# Patient Record
Sex: Female | Born: 1965 | Race: White | Hispanic: No | Marital: Married | Smoking: Current every day smoker
Health system: Southern US, Community
[De-identification: ages and names within clinical notes are randomized; demographics above are authoritative.]

## PROBLEM LIST (undated history)

## (undated) DIAGNOSIS — N83209 Unspecified ovarian cyst, unspecified side: Secondary | ICD-10-CM

## (undated) DIAGNOSIS — B977 Papillomavirus as the cause of diseases classified elsewhere: Secondary | ICD-10-CM

## (undated) DIAGNOSIS — G43909 Migraine, unspecified, not intractable, without status migrainosus: Secondary | ICD-10-CM

## (undated) DIAGNOSIS — G4700 Insomnia, unspecified: Secondary | ICD-10-CM

## (undated) HISTORY — DX: Unspecified ovarian cyst, unspecified side: N83.209

## (undated) HISTORY — PX: WISDOM TOOTH EXTRACTION: SHX21

## (undated) HISTORY — DX: Papillomavirus as the cause of diseases classified elsewhere: B97.7

## (undated) HISTORY — DX: Migraine, unspecified, not intractable, without status migrainosus: G43.909

## (undated) HISTORY — DX: Insomnia, unspecified: G47.00

---

## 1999-01-02 ENCOUNTER — Other Ambulatory Visit: Admission: RE | Admit: 1999-01-02 | Discharge: 1999-01-02 | Payer: Self-pay | Admitting: *Deleted

## 2000-10-05 ENCOUNTER — Other Ambulatory Visit: Admission: RE | Admit: 2000-10-05 | Discharge: 2000-10-05 | Payer: Self-pay | Admitting: Obstetrics and Gynecology

## 2001-10-27 ENCOUNTER — Other Ambulatory Visit: Admission: RE | Admit: 2001-10-27 | Discharge: 2001-10-27 | Payer: Self-pay | Admitting: *Deleted

## 2002-10-24 ENCOUNTER — Other Ambulatory Visit: Admission: RE | Admit: 2002-10-24 | Discharge: 2002-10-24 | Payer: Self-pay | Admitting: Obstetrics and Gynecology

## 2003-11-30 ENCOUNTER — Other Ambulatory Visit: Admission: RE | Admit: 2003-11-30 | Discharge: 2003-11-30 | Payer: Self-pay | Admitting: Obstetrics and Gynecology

## 2004-08-13 ENCOUNTER — Ambulatory Visit (HOSPITAL_COMMUNITY): Admission: RE | Admit: 2004-08-13 | Discharge: 2004-08-13 | Payer: Self-pay | Admitting: Family Medicine

## 2004-11-19 ENCOUNTER — Ambulatory Visit (HOSPITAL_COMMUNITY): Admission: RE | Admit: 2004-11-19 | Discharge: 2004-11-19 | Payer: Self-pay | Admitting: Family Medicine

## 2005-02-10 ENCOUNTER — Other Ambulatory Visit: Admission: RE | Admit: 2005-02-10 | Discharge: 2005-02-10 | Payer: Self-pay | Admitting: Obstetrics and Gynecology

## 2006-02-12 ENCOUNTER — Other Ambulatory Visit: Admission: RE | Admit: 2006-02-12 | Discharge: 2006-02-12 | Payer: Self-pay | Admitting: Obstetrics and Gynecology

## 2010-04-28 ENCOUNTER — Encounter
Admission: RE | Admit: 2010-04-28 | Discharge: 2010-04-28 | Payer: Self-pay | Source: Home / Self Care | Attending: Allergy and Immunology | Admitting: Allergy and Immunology

## 2010-05-23 ENCOUNTER — Ambulatory Visit
Admission: RE | Admit: 2010-05-23 | Discharge: 2010-05-23 | Disposition: A | Discharge status: Home / Self Care | Payer: BC Managed Care – PPO | Source: Ambulatory Visit

## 2010-05-23 ENCOUNTER — Other Ambulatory Visit: Payer: Self-pay

## 2010-05-23 DIAGNOSIS — T1490XA Injury, unspecified, initial encounter: Secondary | ICD-10-CM

## 2010-06-05 ENCOUNTER — Other Ambulatory Visit: Payer: Self-pay | Admitting: Pulmonary Disease

## 2010-06-05 DIAGNOSIS — R05 Cough: Secondary | ICD-10-CM

## 2010-06-06 MED ORDER — IOHEXOL 300 MG/ML  SOLN: 100.0000 mL | Freq: Once | INTRAMUSCULAR | Status: AC | PRN

## 2010-06-09 ENCOUNTER — Ambulatory Visit
Admission: RE | Admit: 2010-06-09 | Discharge: 2010-06-09 | Disposition: A | Discharge status: Home / Self Care | Payer: BC Managed Care – PPO | Source: Ambulatory Visit | Attending: Pulmonary Disease | Admitting: Pulmonary Disease

## 2010-06-09 DIAGNOSIS — R05 Cough: Secondary | ICD-10-CM

## 2010-06-10 ENCOUNTER — Observation Stay (HOSPITAL_COMMUNITY)
Admission: EM | Admit: 2010-06-10 | Discharge: 2010-06-11 | Disposition: A | Discharge status: Home / Self Care | Payer: BC Managed Care – PPO | Attending: Family Medicine | Admitting: Family Medicine

## 2010-06-10 ENCOUNTER — Emergency Department (HOSPITAL_COMMUNITY): Payer: BC Managed Care – PPO

## 2010-06-10 DIAGNOSIS — R059 Cough, unspecified: Secondary | ICD-10-CM | POA: Insufficient documentation

## 2010-06-10 DIAGNOSIS — J441 Chronic obstructive pulmonary disease with (acute) exacerbation: Secondary | ICD-10-CM

## 2010-06-10 DIAGNOSIS — R062 Wheezing: Secondary | ICD-10-CM | POA: Insufficient documentation

## 2010-06-10 DIAGNOSIS — J45901 Unspecified asthma with (acute) exacerbation: Secondary | ICD-10-CM

## 2010-06-10 DIAGNOSIS — D72829 Elevated white blood cell count, unspecified: Secondary | ICD-10-CM | POA: Insufficient documentation

## 2010-06-10 DIAGNOSIS — F17200 Nicotine dependence, unspecified, uncomplicated: Secondary | ICD-10-CM | POA: Insufficient documentation

## 2010-06-10 DIAGNOSIS — R05 Cough: Secondary | ICD-10-CM | POA: Insufficient documentation

## 2010-06-10 LAB — DIFFERENTIAL
Basophils Relative: 0 % (ref 0–1)
Eosinophils Absolute: 0 10*3/uL (ref 0.0–0.7)
Lymphs Abs: 1.5 10*3/uL (ref 0.7–4.0)
Monocytes Relative: 8 % (ref 3–12)
Neutro Abs: 11.6 10*3/uL — ABNORMAL HIGH (ref 1.7–7.7)
Neutrophils Relative %: 81 % — ABNORMAL HIGH (ref 43–77)

## 2010-06-10 LAB — COMPREHENSIVE METABOLIC PANEL
ALT: 36 U/L — ABNORMAL HIGH (ref 0–35)
Alkaline Phosphatase: 52 U/L (ref 39–117)
GFR calc Af Amer: >60 mL/min (ref >60)
GFR calc non Af Amer: >60 mL/min (ref >60)
Potassium: 4.2 mEq/L (ref 3.5–5.1)
Sodium: 141 mEq/L (ref 135–145)

## 2010-06-10 LAB — CBC
HCT: 39.7 % (ref 36.0–46.0)
Hemoglobin: 13.6 g/dL (ref 12.0–15.0)
MCHC: 34.3 g/dL (ref 30.0–36.0)
RDW: 14.6 % (ref 11.5–15.5)
WBC: 14.4 10*3/uL — ABNORMAL HIGH (ref 4.0–10.5)

## 2010-06-10 LAB — D-DIMER, QUANTITATIVE: D-Dimer, Quant: <0.22 ug/mL-FEU (ref 0.00–0.48)

## 2010-06-11 LAB — BASIC METABOLIC PANEL
CO2: 24 mEq/L (ref 19–32)
Calcium: 9.6 mg/dL (ref 8.4–10.5)
Chloride: 103 mEq/L (ref 96–112)
GFR calc Af Amer: >60 mL/min (ref >60)
Potassium: 4.3 mEq/L (ref 3.5–5.1)

## 2010-06-11 LAB — CBC
MCV: 85.1 fL (ref 78.0–100.0)
Platelets: 371 10*3/uL (ref 150–400)

## 2010-06-30 NOTE — H&P (Signed)
NAME:  Rebecca Mcguire, Rebecca Mcguire NO.:  [id number]  MEDICAL RECORD NO.:  [id number]           PATIENT TYPE:  I  LOCATION:  4505                         FACILITY:  [facility]  PHYSICIAN:  Santiago Bumpers. Demosthenes Virnig, M.D.DATE OF BIRTH:  [birth year]  DATE OF ADMISSION:  06/10/2010 DATE OF DISCHARGE:                             HISTORY & PHYSICAL   PRIMARY CARE PROVIDER:  [facility].  PULMONOLOGIST:  Kerry Kass, MD [facility].  CHIEF COMPLAINT:  Wheezing, cough, and shortness of breath.  HISTORY OF PRESENT ILLNESS:  The patient states that she has been dealing with a cough and shortness of breath since October.  She says that she has seen her pulmonologist multiple times and she had gotten a little bit better for a while.  However, last Thursday, she became much worse, coughing, shortness of breath, and wheezing worsened.  She saw her pulmonologist again, who started her on Avelox and prednisone.  She says that she went back for followup today and she had not gotten any better, so her pulmonologist felt that she needed to be admitted.  REVIEW OF SYSTEMS:  GENERAL:  Positive for low-grade fever.  HEAD, EYES, EARS, NOSE, AND THROAT: Positive for rhinorrhea.  CARDIOVASCULAR: Negative for chest pain.  RESPIRATORY: Positive for cough and dyspnea. Negative for sputum.  GI:  Negative for nausea, vomiting, abdominal pain.  MUSCULOSKELETAL:  Negative for myalgias or arthralgias. NEUROLOGIC:  Negative for dizziness.  Negative for headaches.  PAST MEDICAL HISTORY:  Significant for: 1. Menstrual migraine headaches. 2. Chronic bronchitis.  PAST SURGICAL HISTORY:  Negative.  SOCIAL HISTORY:  The patient lives with her boyfriend.  She works on Network engineer at [employer].  She has quit smoking about 2 months ago, but smoked for about 20 years, about half a pack per day.  She denies alcohol or drug use.  FAMILY HISTORY:  Significant  only for allergies in her father and siblings.  ALLERGIES:  The patient has GI reaction, vomiting, and abdominal pain to BIAXIN.  No other known drug allergies.  MEDICATIONS: 1. Prednisone tapered. 2. Avelox 400 mg p.o. daily. 3. Oral contraceptive pill. 4. Celexa 40 mg p.o. daily. 5. Celebrex p.r.n. for pain. 6. Vicodin p.r.n. for migraines. 7. Amerge p.r.n. for migraines. 8. Symbicort inhaled b.i.d. 9. Albuterol inhaler 2 puffs inhaled q.4 p.r.n. shortness of breath.  PHYSICAL EXAMINATION:  VITAL SIGNS:  Temperature 98.2, pulse 85, respiratory rate 22, blood pressure 130/82, pulse ox is 99% on room air. GENERAL:  The patient is in no acute distress. HEAD, EYES, EARS, NOSE, AND THROAT:  Extraocular movements intact.  Oral mucosa is moist. NECK:  Supple. CARDIOVASCULAR:  Regular rate and rhythm.  No murmurs, rubs, or gallops. CHEST:  Positive pain with sternal palpation. LUNGS:  Good air movement, but scattered wheezes in the left greater than right lung. ABDOMEN:  Positive bowel sounds.  Soft and nontender. EXTREMITIES:  2+ pulses.  No edema. NEUROLOGIC:  Alert and oriented x3.  No focal deficits. MUSCULOSKELETAL:  Right index finger is in splint, status post fracture.  LABS  AND STUDIES:  Complete metabolic panel:  Sodium 141, potassium 4.2, chloride 104, CO2 of 28, BUN 8, creatinine 0.65, glucose 84.  Total bilirubin 0.5, alk phos 52, AST 31, ALT 36, total protein 7.5, albumin 12.5, calcium 9.7.  CBC:  White blood cell count 14.4, hemoglobin 13.6, hematocrit 39.7, platelets 391, neutrophils 81%, lymphocytes 11%, monocytes 8%.  Chest x-ray:  No active cardiopulmonary disease.  ASSESSMENT AND PLAN:  This is a 45 year old female, who presents with shortness of breath, cough, and wheezing, who has failed outpatient treatment. 1. Shortness of breath, cough, and wheezing:  The patient does not     carry a diagnosis of chronic obstructive pulmonary disease, but     from her  imaging studies and symptoms, I feel this is most likely a     chronic obstructive pulmonary disease exacerbation.  We will treat     her with prednisone, Avelox, and albuterol and Atrovent nebulizer     treatments.  We will also order a D-dimer and EKG to evaluate her     other causes of her symptoms.  We will give Vicodin p.r.n. for her     cough pain.  We will recommend that the patient get pulmonary     function tests for evaluation of her lung disease as an outpatient. 2. Leukocytosis:  The patient has been on prednisone.  This is most     likely cause.  We will monitor for fever and other signs of     infection. 3. Tobacco use:  The patient quit smoking about 2 months ago due to     her symptoms.  She does not feel she needs further smoking     cessation help at this time. 4. Fluids, electrolytes, nutrition/gastrointestinal:  We will Hep-Lock     her IV and give her a regular diet.  We will give Protonix to     empirically treat for gastroesophageal reflux disease symptoms as     possible other cause of     cough. 5. Deep venous thrombosis prophylaxis:  Heparin 5000 units subcu t.i.d. 6. Disposition:  Pending clinical improvement.    ______________________________ Ardyth Gal, MD   ______________________________ Santiago Bumpers. Leveda Anna, M.D.    CR/MEDQ  D:  06/10/2010  T:  06/11/2010  Job:  [id number]  Electronically Signed by Ardyth Gal MD on 06/29/2010 12:03:45 PM Electronically Signed by Doralee Albino M.D. on 06/30/2010 09:09:18 AM

## 2010-06-30 NOTE — Discharge Summary (Signed)
NAME:  Rebecca Mcguire, Rebecca Mcguire NO.:  [id number]  MEDICAL RECORD NO.:  [id number]           PATIENT TYPE:  I  LOCATION:  4505                         FACILITY:  [facility]  PHYSICIAN:  Santiago Bumpers. Saw Mendenhall, M.D.DATE OF BIRTH:  1966-01-17  DATE OF ADMISSION:  06/10/2010 DATE OF DISCHARGE:  06/11/2010                              DISCHARGE SUMMARY   PRIMARY CARE PROVIDER:  [facility].  PULMONOLOGIST:  Dr. Corinda [facility]ubler at [facility].  DISCHARGE DIAGNOSIS:  Chronic obstructive pulmonary disease versus asthma exacerbation.  DISCHARGE MEDICATIONS: 1. Prednisone 60 mg p.o. daily x3 days, then 40 mg p.o. daily x3 days,     then 20 mg p.o. daily x3 days, then 10 mg p.o. daily x4 days. 2. Citalopram 40 mg p.o. daily. 3. Vicodin 5/325 p.o. q.6 h. p.r.n. cough pain. 4. Avelox 400 mg p.o. daily x3 days. 5. Albuterol inhaler, use with spacer every 4 hours while awake x1     day, then use every 4 hours as needed for shortness of breath. 6. Symbicort 2 puffs inhaled daily.  PERTINENT LABORATORY VALUES:  On June 10, 2010, a CBC with differential significant only for white blood cell count of 14.4. Comprehensive metabolic panel was within normal limits.  D-dimer was less than 0.22.  RADIOLOGY:  June 10, 2010, a chest x-ray two-view showed stable examination, no active cardiopulmonary process.  BRIEF HOSPITAL COURSE:  Rebecca Mcguire is a 45 year old female who presented to the emergency department after seeing her pulmonologist, Dr. Corinda Gubler for continued wheezing, cough, and shortness of breath. 1. Pulmonology.  The patient has had a chronic bronchitis since     October 2011.  Her chest x-ray is suggestive of chronic lung     disease of asthma for COPD.  The patient only has about a 10-pack-     year history of smoking, so it is unclear what the etiology of her     lung disease as COPD usually does not develop after only 10-pack-     year of  smoking, however, the Family Medicine Team admitted her to     the floor and treated her for COPD exacerbation with scheduled     Atrovent and albuterol nebulizers.  The patient received one dose     of IV Solu-Medrol in the emergency room and then was switched to     p.o. prednisone.  She was also continued on Avelox 400 mg p.o.     daily that had been started in outpatient setting.  The patient was     given Vicodin for her cough.  On admission, the patient had poor     air movement and diffuse wheezes throughout her lungs.  On the day     of discharge, she had minimal wheezes and good air movement     throughout her lungs.  She was on room air during the entire     hospitalization.  She was discharged home with a prednisone taper.     Avelox to complete a 10-day course.  Symbicort and albuterol, she     is  to schedule the albuterol for 24 hours and then use it p.r.n.  FOLLOWUP ISSUES/RECOMMENDATIONS:  The patient should follow up with Dr. Corinda [facility] at [facility] for management of her lung disease.  The patient was discharged home in stable medical condition.    ______________________________ Ardyth Gal, MD   ______________________________ Santiago Bumpers. Leveda Anna, M.D.    CR/MEDQ  D:  06/11/2010  T:  06/11/2010  Job:  [id number]  cc:   [facility] Dr. Corinda Gubler  Electronically Signed by Ardyth Gal MD on 06/29/2010 12:03:21 PM Electronically Signed by Doralee Albino M.D. on 06/30/2010 04:54:09 AM

## 2010-08-11 ENCOUNTER — Other Ambulatory Visit: Payer: Self-pay | Admitting: Orthopedic Surgery

## 2010-08-11 DIAGNOSIS — T1490XA Injury, unspecified, initial encounter: Secondary | ICD-10-CM

## 2011-06-11 ENCOUNTER — Other Ambulatory Visit: Payer: Self-pay | Admitting: Obstetrics and Gynecology

## 2011-06-11 DIAGNOSIS — Z1231 Encounter for screening mammogram for malignant neoplasm of breast: Secondary | ICD-10-CM

## 2011-06-22 ENCOUNTER — Ambulatory Visit
Admission: RE | Admit: 2011-06-22 | Discharge: 2011-06-22 | Disposition: A | Discharge status: Home / Self Care | Payer: BC Managed Care – PPO | Source: Ambulatory Visit | Attending: Obstetrics and Gynecology | Admitting: Obstetrics and Gynecology

## 2011-06-22 DIAGNOSIS — Z1231 Encounter for screening mammogram for malignant neoplasm of breast: Secondary | ICD-10-CM

## 2011-07-02 ENCOUNTER — Encounter (INDEPENDENT_AMBULATORY_CARE_PROVIDER_SITE_OTHER): Payer: BC Managed Care – PPO

## 2011-07-02 ENCOUNTER — Encounter (INDEPENDENT_AMBULATORY_CARE_PROVIDER_SITE_OTHER): Payer: BC Managed Care – PPO | Admitting: Obstetrics and Gynecology

## 2011-07-02 DIAGNOSIS — I780 Hereditary hemorrhagic telangiectasia: Secondary | ICD-10-CM

## 2011-07-02 DIAGNOSIS — N949 Unspecified condition associated with female genital organs and menstrual cycle: Secondary | ICD-10-CM

## 2011-07-02 DIAGNOSIS — N925 Other specified irregular menstruation: Secondary | ICD-10-CM

## 2011-09-08 ENCOUNTER — Ambulatory Visit
Admission: RE | Admit: 2011-09-08 | Discharge: 2011-09-08 | Disposition: A | Discharge status: Home / Self Care | Payer: BC Managed Care – PPO | Source: Ambulatory Visit | Attending: Allergy and Immunology | Admitting: Allergy and Immunology

## 2011-09-08 ENCOUNTER — Other Ambulatory Visit: Payer: Self-pay | Admitting: Allergy and Immunology

## 2011-09-08 DIAGNOSIS — J45901 Unspecified asthma with (acute) exacerbation: Secondary | ICD-10-CM

## 2012-06-10 ENCOUNTER — Other Ambulatory Visit: Payer: Self-pay | Admitting: Obstetrics and Gynecology

## 2012-06-14 ENCOUNTER — Other Ambulatory Visit: Payer: Self-pay | Admitting: Obstetrics and Gynecology

## 2012-06-14 MED ORDER — NORETHINDRONE 0.35 MG PO TABS: 1.0000 | ORAL_TABLET | Freq: Every day | ORAL | Status: AC

## 2012-06-14 NOTE — Telephone Encounter (Signed)
Spoke with pt rgd msg pt wants rf on rx until aex in April informed pt rx sent to pharm pt voice understanding

## 2012-08-26 ENCOUNTER — Other Ambulatory Visit: Payer: Self-pay

## 2012-08-26 DIAGNOSIS — Z1231 Encounter for screening mammogram for malignant neoplasm of breast: Secondary | ICD-10-CM

## 2012-10-26 IMAGING — CR DG CHEST 2V
2 series · 2 of 2 positions shown · non-contrast
Comparison: None

CLINICAL DATA: Cough.

CHEST - 2 VIEW

[w chest pa]
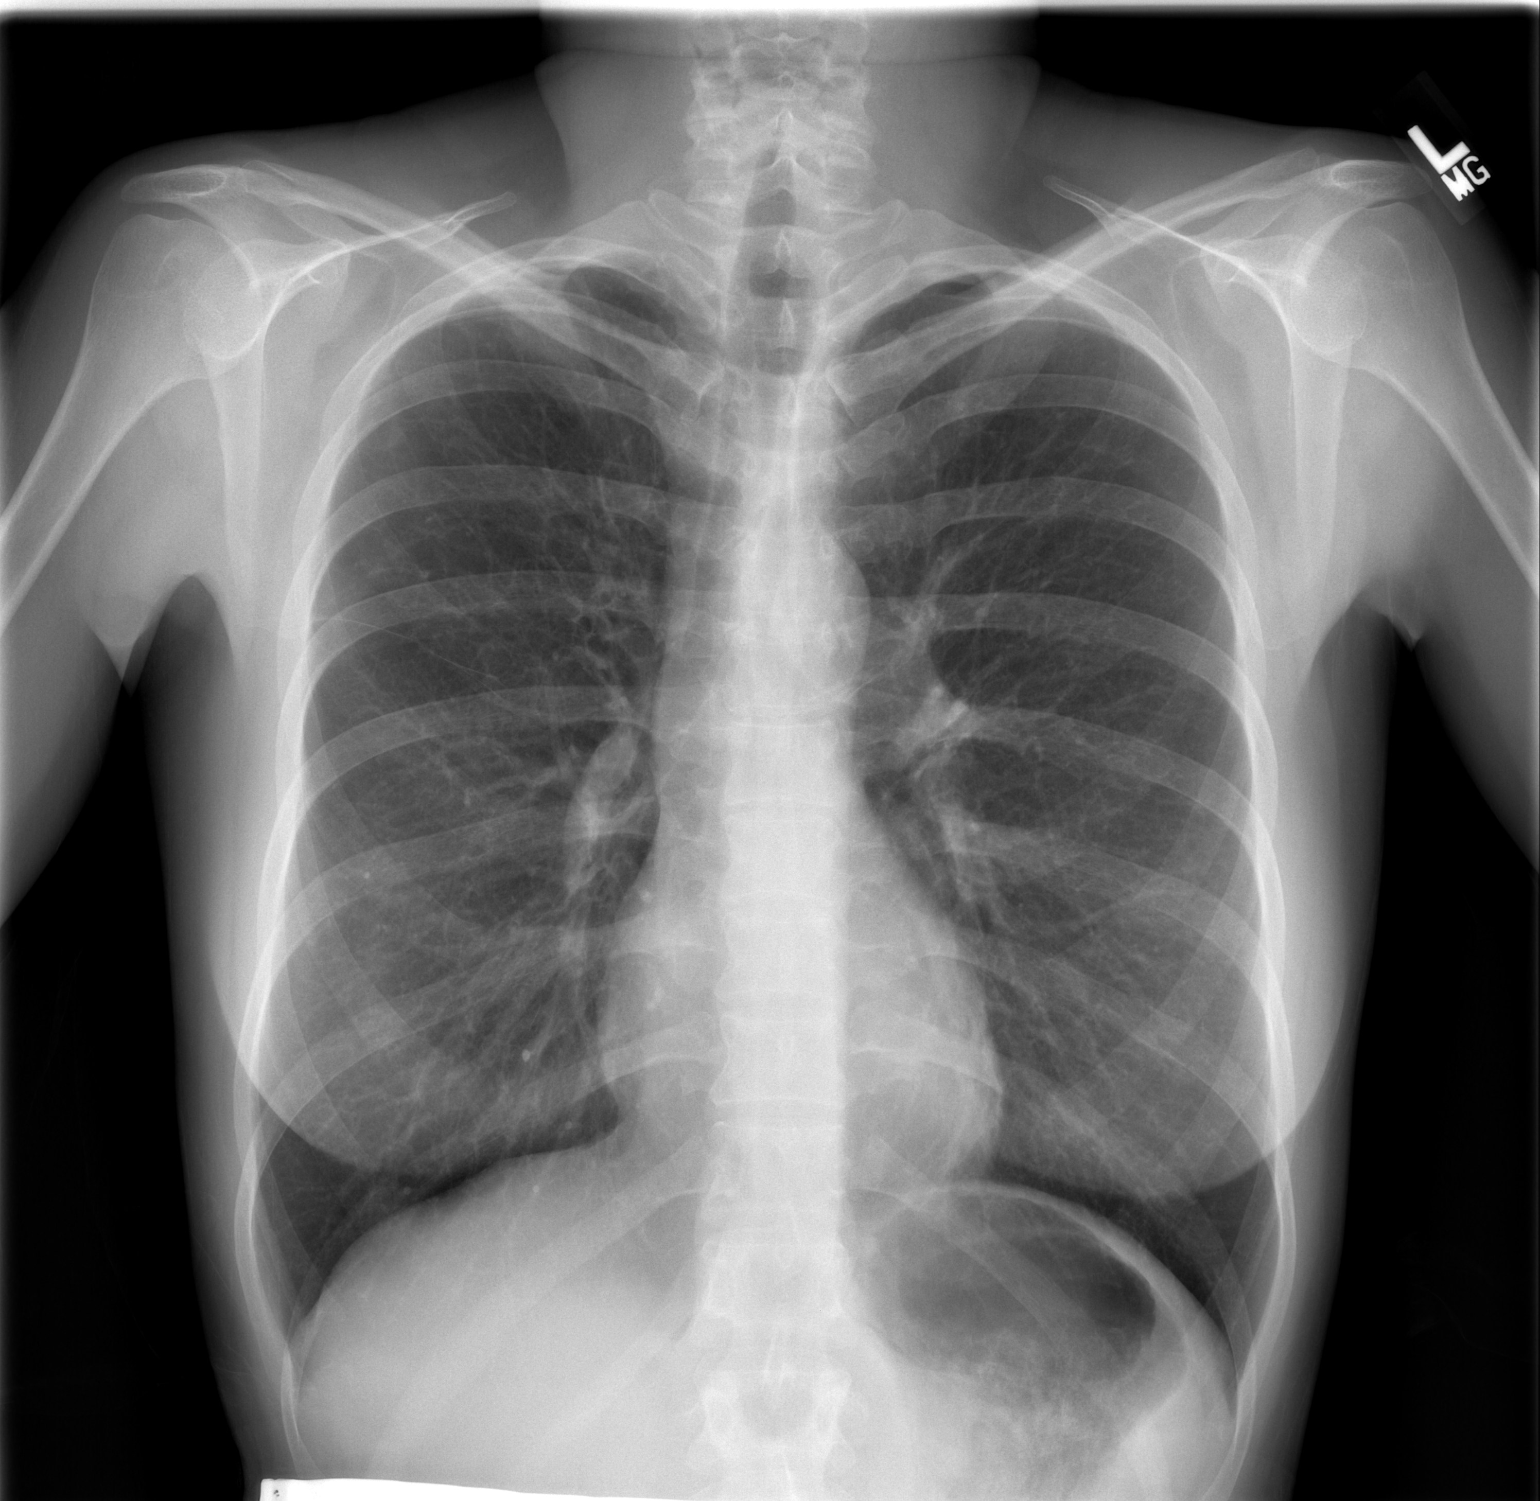

[w chest lat]
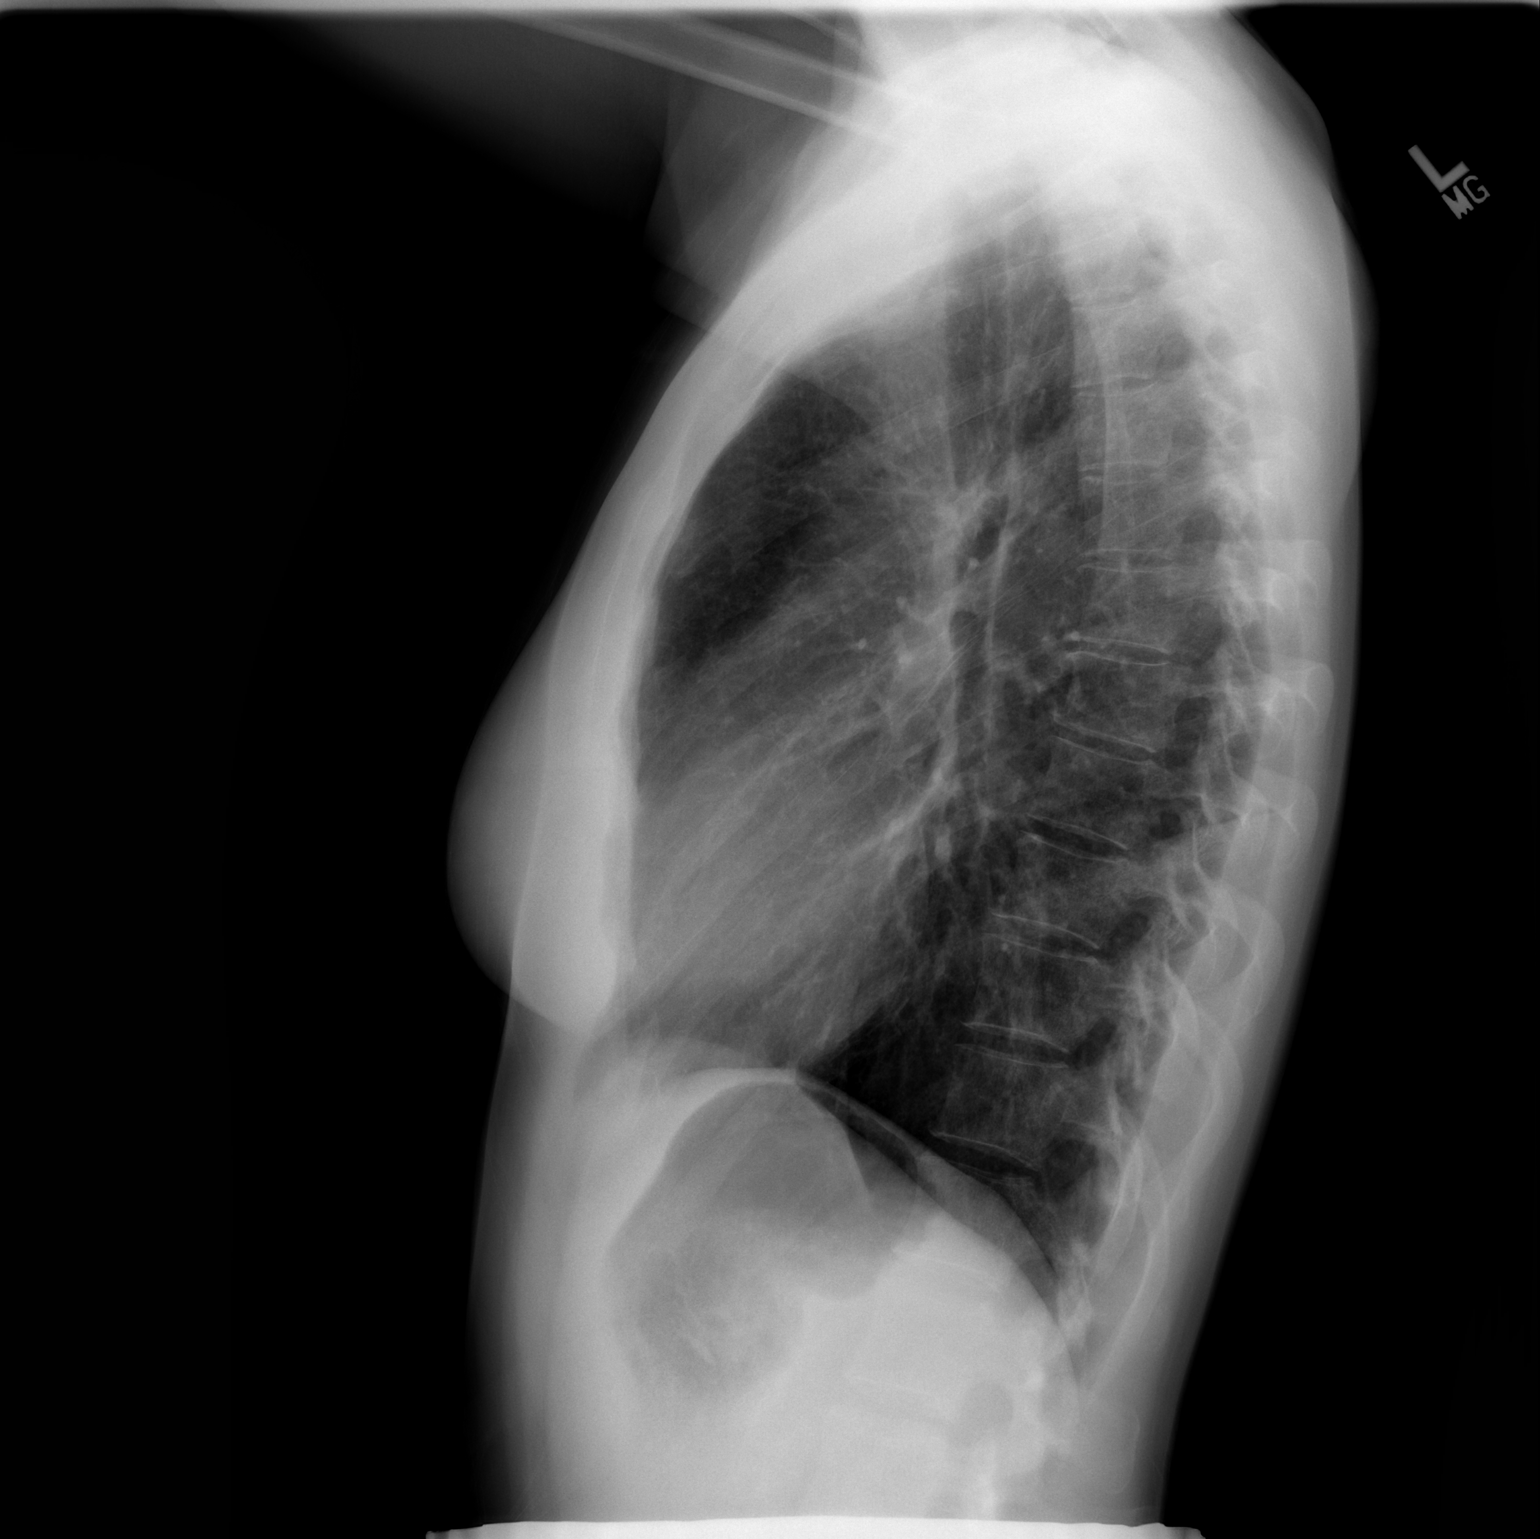

[2 of 2 positions shown; findings below may reference images not displayed]

FINDINGS: The cardiac silhouette, mediastinal and hilar contours
are within normal limits.  The lungs demonstrate mild
hyperinflation.  Vague densities in the right lung apex may be
scarring changes.  Prior films would be helpful for comparison.  If
none are available I would recommend a follow-up chest x-ray in 4
months to document stability.  No infiltrates, edema or effusions.
The bony thorax is intact.
IMPRESSION: 1.  Hyperinflation and possible early emphysematous changes.
2.  Possible right apical scarring changes.  Recommend correlation
with prior chest films or if none are available a follow-up chest x-
ray in 4 months.

## 2013-01-02 ENCOUNTER — Other Ambulatory Visit: Payer: Self-pay | Admitting: Family Medicine

## 2013-01-02 ENCOUNTER — Ambulatory Visit
Admission: RE | Admit: 2013-01-02 | Discharge: 2013-01-02 | Disposition: A | Discharge status: Home / Self Care | Payer: BC Managed Care – PPO | Source: Ambulatory Visit | Attending: Family Medicine | Admitting: Family Medicine

## 2013-01-02 DIAGNOSIS — S0003XA Contusion of scalp, initial encounter: Secondary | ICD-10-CM

## 2013-09-25 ENCOUNTER — Other Ambulatory Visit: Payer: Self-pay

## 2013-09-25 ENCOUNTER — Other Ambulatory Visit: Payer: Self-pay | Admitting: Obstetrics and Gynecology

## 2013-09-25 DIAGNOSIS — Z1231 Encounter for screening mammogram for malignant neoplasm of breast: Secondary | ICD-10-CM

## 2013-10-02 ENCOUNTER — Ambulatory Visit: Payer: BC Managed Care – PPO

## 2013-10-05 ENCOUNTER — Ambulatory Visit
Admission: RE | Admit: 2013-10-05 | Discharge: 2013-10-05 | Disposition: A | Discharge status: Home / Self Care | Payer: BC Managed Care – PPO | Source: Ambulatory Visit | Attending: Obstetrics and Gynecology | Admitting: Obstetrics and Gynecology

## 2013-10-05 DIAGNOSIS — Z1231 Encounter for screening mammogram for malignant neoplasm of breast: Secondary | ICD-10-CM

## 2016-02-05 ENCOUNTER — Other Ambulatory Visit: Payer: Self-pay | Admitting: Otolaryngology

## 2016-02-07 ENCOUNTER — Other Ambulatory Visit: Payer: Self-pay | Admitting: Otolaryngology

## 2016-02-10 ENCOUNTER — Other Ambulatory Visit: Payer: Self-pay | Admitting: Otolaryngology

## 2016-02-10 DIAGNOSIS — H9041 Sensorineural hearing loss, unilateral, right ear, with unrestricted hearing on the contralateral side: Secondary | ICD-10-CM

## 2016-02-10 DIAGNOSIS — H9311 Tinnitus, right ear: Secondary | ICD-10-CM

## 2016-02-10 DIAGNOSIS — IMO0001 Reserved for inherently not codable concepts without codable children: Secondary | ICD-10-CM

## 2016-02-13 ENCOUNTER — Ambulatory Visit
Admission: RE | Admit: 2016-02-13 | Discharge: 2016-02-13 | Disposition: A | Discharge status: Home / Self Care | Payer: BC Managed Care – PPO | Source: Ambulatory Visit | Attending: Otolaryngology | Admitting: Otolaryngology

## 2016-02-13 DIAGNOSIS — H9041 Sensorineural hearing loss, unilateral, right ear, with unrestricted hearing on the contralateral side: Secondary | ICD-10-CM

## 2016-02-13 DIAGNOSIS — H9311 Tinnitus, right ear: Secondary | ICD-10-CM

## 2016-02-13 DIAGNOSIS — IMO0001 Reserved for inherently not codable concepts without codable children: Secondary | ICD-10-CM

## 2016-02-13 MED ORDER — GADOBENATE DIMEGLUMINE 529 MG/ML IV SOLN
10.0000 mL | Freq: Once | INTRAVENOUS | Status: AC | PRN
  Administered 2016-02-13: 10 mL via INTRAVENOUS

## 2017-04-15 ENCOUNTER — Encounter: Payer: Self-pay | Admitting: Gastroenterology

## 2017-05-24 ENCOUNTER — Encounter: Payer: Self-pay | Admitting: Gastroenterology

## 2017-05-24 ENCOUNTER — Encounter: Payer: Self-pay | Admitting: *Deleted

## 2017-05-28 ENCOUNTER — Encounter (INDEPENDENT_AMBULATORY_CARE_PROVIDER_SITE_OTHER): Payer: Self-pay

## 2017-05-28 ENCOUNTER — Encounter: Payer: Self-pay | Admitting: Gastroenterology

## 2017-05-28 ENCOUNTER — Ambulatory Visit: Payer: BC Managed Care – PPO | Admitting: Gastroenterology

## 2017-05-28 VITALS — BP 98/60 | HR 66 | Ht 66.0 in | Wt 119.2 lb

## 2017-05-28 DIAGNOSIS — K649 Unspecified hemorrhoids: Secondary | ICD-10-CM

## 2017-05-28 DIAGNOSIS — K589 Irritable bowel syndrome without diarrhea: Secondary | ICD-10-CM | POA: Diagnosis not present

## 2017-05-28 DIAGNOSIS — L290 Pruritus ani: Secondary | ICD-10-CM

## 2017-05-28 NOTE — Patient Instructions (Signed)
If you are age 865 or older, your body mass index should be between 23-30. Your Body mass index is 19.25 kg/m. If this is out of the aforementioned range listed, please consider follow up with your Primary Care Provider.  If you are age 50 or younger, your body mass index should be between 19-25. Your Body mass index is 19.25 kg/m. If this is out of the aformentioned range listed, please consider follow up with your Primary Care Provider.   Use Preparation H suppositories at bedtime for 5 days.  Start Benefiber 1 tablespoon three times daily with meals.  You have been given Constipation literature.  Recall for colonoscopy Sept. 2019.  Thank you for choosing me and [facility].   Angelena SoleK. Nandigam, MD

## 2017-05-28 NOTE — Progress Notes (Signed)
Rebecca Mcguire    [id number]    [birth year]  Primary Care Physician:Mcguire, Rebecca Beechamynthia, MD  Referring Physician: Laurann Mcguire, Rebecca Mcguire. [address] [place], [place] [id number]  Chief complaint:  Anal itching, Hemorrhoids  HPI: [age] yr F here with complaints of worsening anal itching and burning sensation since September 2018. She was diagnosed around that time with genital herpes and she had peri anal lesions.  She denies any blood per rectum or blood in stool.  Occasionally she has noticed pinkish tinged fluid when she wipes multiple times after she strained with bowel movement.  She is taking MiraLAX as needed on and off and currently has regular bowel movements.  She is being treated for genital herpes and has noticed improvement with the perianal lesions and also itching.   Outpatient Encounter Medications as of 05/28/2017  Medication Sig  . albuterol (PROVENTIL) (2.5 MG/3ML) 0.083% nebulizer solution Take 2.5 mg by nebulization every 6 (six) hours as needed for wheezing or shortness of breath.  . ALPRAZolam (XANAX) 0.5 MG tablet Take 0.5 mg by mouth at bedtime as needed for anxiety.  . budesonide-formoterol (SYMBICORT) 160-4.5 MCG/ACT inhaler Inhale 2 puffs into the lungs 2 (two) times daily.  Marland Kitchen. EPINEPHrine (ADRENALIN) 0.1 % nasal solution Place 1 drop into the nose once.  . metaxalone (SKELAXIN) 800 MG tablet Take 800 mg by mouth 3 (three) times daily.  . mometasone-formoterol (DULERA) 100-5 MCG/ACT AERO Inhale 2 puffs into the lungs 2 (two) times daily.  . montelukast (SINGULAIR) 10 MG tablet Take 10 mg by mouth at bedtime.  . Multiple Vitamin (MULTIVITAMIN) tablet Take 1 tablet by mouth daily.  . naratriptan (AMERGE) 2.5 MG tablet Take 2.5 mg by mouth as needed for migraine. Take one (1) tablet at onset of headache; if returns or does not resolve, may repeat after 4 hours; do not exceed five (5) mg in 24 hours.  . norethindrone (CAMILA) 0.35 MG tablet Take 1 tablet  (0.35 mg total) by mouth daily.  Marland Kitchen. omeprazole (PRILOSEC) 40 MG capsule Take 40 mg by mouth daily.  . promethazine (PHENERGAN) 25 MG suppository Place 25 mg rectally every 6 (six) hours as needed for nausea or vomiting.  . triamterene-hydrochlorothiazide (DYAZIDE) 37.5-25 MG capsule Take 1 capsule by mouth daily.  . valACYclovir (VALTREX) 500 MG tablet Take 500 mg by mouth 2 (two) times daily.   No facility-administered encounter medications on file as of 05/28/2017.     Allergies as of 05/28/2017 - Review Complete 05/28/2017  Allergen Reaction Noted  . Biaxin [clarithromycin]  05/24/2017  . Celebrex [celecoxib]  05/24/2017  . Nsaids  05/24/2017  . Shellfish-derived products  05/24/2017    Past Medical History:  Diagnosis Date  . HPV in female   . Insomnia   . Migraine   . Ovarian cyst     Past Surgical History:  Procedure Laterality Date  . WISDOM TOOTH EXTRACTION      Family History  Problem Relation Age of Onset  . Other Mother     Social History   Socioeconomic History  . Marital status: Married    Spouse name: Not on file  . Number of children: 0  . Years of education: Not on file  . Highest education level: Not on file  Social Needs  . Financial resource strain: Not on file  . Food insecurity - worry: Not on file  .  Food insecurity - inability: Not on file  . Transportation needs - medical: Not on file  . Transportation needs - non-medical: Not on file  Occupational History  . Not on file  Tobacco Use  . Smoking status: Current Every Day Smoker  . Smokeless tobacco: Never Used  Substance and Sexual Activity  . Alcohol use: Yes    Alcohol/week: 2.4 oz    Types: 4 Cans of beer per week    Comment: weekends  . Drug use: No  . Sexual activity: Yes    Partners: Male    Birth control/protection: Pill  Other Topics Concern  . Not on file  Social History Narrative  . Not on file      Review of systems: Review of Systems  Constitutional: Negative for  fever and chills.  HENT: Negative.   Eyes: Negative for blurred vision.  Respiratory: Negative for cough, shortness of breath and wheezing.   Cardiovascular: Negative for chest pain and palpitations.  Gastrointestinal: as per HPI Genitourinary: Negative for dysuria, urgency, frequency and hematuria.  Musculoskeletal: Negative for myalgias, back pain and joint pain.  Skin: Negative for itching and rash.  Neurological: Negative for dizziness, tremors, focal weakness, seizures and loss of consciousness.  Endo/Heme/Allergies: Positive for seasonal allergies.  Psychiatric/Behavioral: Negative for depression, suicidal ideas and hallucinations.  All other systems reviewed and are negative.   Physical Exam: Vitals:   05/28/17 0911  BP: 98/60  Pulse: 66   Body mass index is 19.25 kg/m. Gen:      No acute distress HEENT:  EOMI, sclera anicteric Neck:     No masses; no thyromegaly Lungs:    Clear to auscultation bilaterally; normal respiratory effort CV:         Regular rate and rhythm; no murmurs Abd:      + bowel sounds; soft, non-tender; no palpable masses, no distension Ext:    No edema; adequate peripheral perfusion Skin:      Warm and dry; no rash Neuro: alert and oriented x 3 Psych: normal mood and affect Rectal exam: Normal anal sphincter tone, no anal fissure or external hemorrhoids Anoscopy: Small internal hemorrhoids, no active bleeding, normal dentate line, no visible nodules  Data Reviewed:  Reviewed labs, radiology imaging, old records and pertinent past GI work up   Assessment and Plan/Recommendations: 52 year old female complaining of anal itching, recently treated for genital herpes with perianal lesions On rectal exam she has small internal hemorrhoids, external skin tag, no vescicles or herpetic lesions Anal itching is improving after treatment of genital herpes. Colonoscopy in 6 months for colorectal cancer screening at age 60 or sooner if develops rectal  bleeding Preparation H suppository at bedtime as needed  IBS-Constipation: Increase dietary fiber and fluid intake Benefiber 1 tablespoon TID with meals    K. Scherry Ran , MD [phone] Mon-Fri 8a-5p [phone] after 5p, weekends, holidays  CC: Rebecca Montana, MD

## 2017-11-08 ENCOUNTER — Encounter: Payer: Self-pay | Admitting: Gastroenterology

## 2018-12-20 DEATH — deceased
